# Patient Record
Sex: Female | Born: 1965 | Race: White | Hispanic: No | Marital: Married | Smoking: Never smoker
Health system: Southern US, Community
[De-identification: ages and names within clinical notes are randomized; demographics above are authoritative.]

## PROBLEM LIST (undated history)

## (undated) DIAGNOSIS — K219 Gastro-esophageal reflux disease without esophagitis: Secondary | ICD-10-CM

---

## 2004-02-14 ENCOUNTER — Emergency Department: Payer: Self-pay | Admitting: Emergency Medicine

## 2006-05-06 ENCOUNTER — Other Ambulatory Visit: Payer: Self-pay

## 2006-05-06 ENCOUNTER — Emergency Department: Payer: Self-pay | Admitting: Emergency Medicine

## 2013-09-22 ENCOUNTER — Emergency Department: Payer: Self-pay | Admitting: Emergency Medicine

## 2013-09-22 LAB — COMPREHENSIVE METABOLIC PANEL
ALBUMIN: 3.7 g/dL (ref 3.4–5.0)
AST: 25 U/L (ref 15–37)
Alkaline Phosphatase: 49 U/L
Anion Gap: 7 (ref 7–16)
BUN: 13 mg/dL (ref 7–18)
Bilirubin,Total: 0.7 mg/dL (ref 0.2–1.0)
CO2: 25 mmol/L (ref 21–32)
Calcium, Total: 8.8 mg/dL (ref 8.5–10.1)
Chloride: 109 mmol/L — ABNORMAL HIGH (ref 98–107)
Creatinine: 0.86 mg/dL (ref 0.60–1.30)
EGFR (African American): >60
EGFR (Non-African Amer.): >60
Glucose: 104 mg/dL — ABNORMAL HIGH (ref 65–99)
Osmolality: 282 (ref 275–301)
POTASSIUM: 4.3 mmol/L (ref 3.5–5.1)
SGPT (ALT): 21 U/L
Sodium: 141 mmol/L (ref 136–145)
TOTAL PROTEIN: 8 g/dL (ref 6.4–8.2)

## 2013-09-22 LAB — CBC
HCT: 38.9 % (ref 35.0–47.0)
HGB: 12.3 g/dL (ref 12.0–16.0)
MCH: 25 pg — AB (ref 26.0–34.0)
MCHC: 31.7 g/dL — AB (ref 32.0–36.0)
MCV: 79 fL — ABNORMAL LOW (ref 80–100)
PLATELETS: 312 10*3/uL (ref 150–440)
RBC: 4.93 10*6/uL (ref 3.80–5.20)
RDW: 16.6 % — ABNORMAL HIGH (ref 11.5–14.5)
WBC: 6.9 10*3/uL (ref 3.6–11.0)

## 2013-09-22 LAB — TROPONIN I: Troponin-I: <0.02 ng/mL

## 2014-08-21 ENCOUNTER — Ambulatory Visit: Payer: Self-pay | Admitting: Internal Medicine

## 2014-12-04 ENCOUNTER — Ambulatory Visit: Payer: Self-pay | Admitting: Internal Medicine

## 2018-08-17 ENCOUNTER — Observation Stay
Admission: EM | Admit: 2018-08-17 | Discharge: 2018-08-18 | Disposition: A | Discharge status: Home / Self Care | Payer: Managed Care, Other (non HMO) | Attending: Internal Medicine | Admitting: Internal Medicine

## 2018-08-17 ENCOUNTER — Other Ambulatory Visit: Payer: Self-pay

## 2018-08-17 ENCOUNTER — Emergency Department: Payer: Managed Care, Other (non HMO)

## 2018-08-17 ENCOUNTER — Encounter: Payer: Self-pay | Admitting: Emergency Medicine

## 2018-08-17 DIAGNOSIS — Z79899 Other long term (current) drug therapy: Secondary | ICD-10-CM | POA: Diagnosis not present

## 2018-08-17 DIAGNOSIS — D509 Iron deficiency anemia, unspecified: Principal | ICD-10-CM | POA: Insufficient documentation

## 2018-08-17 DIAGNOSIS — K219 Gastro-esophageal reflux disease without esophagitis: Secondary | ICD-10-CM | POA: Diagnosis not present

## 2018-08-17 DIAGNOSIS — R079 Chest pain, unspecified: Secondary | ICD-10-CM

## 2018-08-17 DIAGNOSIS — Z9119 Patient's noncompliance with other medical treatment and regimen: Secondary | ICD-10-CM | POA: Diagnosis not present

## 2018-08-17 DIAGNOSIS — Z791 Long term (current) use of non-steroidal anti-inflammatories (NSAID): Secondary | ICD-10-CM | POA: Insufficient documentation

## 2018-08-17 DIAGNOSIS — R030 Elevated blood-pressure reading, without diagnosis of hypertension: Secondary | ICD-10-CM | POA: Insufficient documentation

## 2018-08-17 DIAGNOSIS — D649 Anemia, unspecified: Secondary | ICD-10-CM | POA: Diagnosis present

## 2018-08-17 DIAGNOSIS — R002 Palpitations: Secondary | ICD-10-CM | POA: Insufficient documentation

## 2018-08-17 HISTORY — DX: Gastro-esophageal reflux disease without esophagitis: K21.9

## 2018-08-17 LAB — CBC
HCT: 28 % — ABNORMAL LOW (ref 36.0–46.0)
Hemoglobin: 7.2 g/dL — ABNORMAL LOW (ref 12.0–15.0)
MCH: 16.9 pg — ABNORMAL LOW (ref 26.0–34.0)
MCHC: 25.7 g/dL — ABNORMAL LOW (ref 30.0–36.0)
MCV: 65.6 fL — ABNORMAL LOW (ref 80.0–100.0)
Platelets: 333 10*3/uL (ref 150–400)
RBC: 4.27 MIL/uL (ref 3.87–5.11)
RDW: 20.7 % — ABNORMAL HIGH (ref 11.5–15.5)
WBC: 5.6 10*3/uL (ref 4.0–10.5)
nRBC: 0 % (ref 0.0–0.2)

## 2018-08-17 LAB — IRON AND TIBC
Iron: 16 ug/dL — ABNORMAL LOW (ref 28–170)
Saturation Ratios: 3 % — ABNORMAL LOW (ref 10.4–31.8)
TIBC: 505 ug/dL — ABNORMAL HIGH (ref 250–450)
UIBC: 489 ug/dL

## 2018-08-17 LAB — BASIC METABOLIC PANEL
Anion gap: 9 (ref 5–15)
BUN: 13 mg/dL (ref 6–20)
CO2: 21 mmol/L — ABNORMAL LOW (ref 22–32)
Calcium: 8.7 mg/dL — ABNORMAL LOW (ref 8.9–10.3)
Chloride: 107 mmol/L (ref 98–111)
Creatinine, Ser: 0.6 mg/dL (ref 0.44–1.00)
GFR calc Af Amer: >60 mL/min (ref >60)
GFR calc non Af Amer: >60 mL/min (ref >60)
Glucose, Bld: 135 mg/dL — ABNORMAL HIGH (ref 70–99)
Potassium: 3.9 mmol/L (ref 3.5–5.1)
Sodium: 137 mmol/L (ref 135–145)

## 2018-08-17 LAB — PREPARE RBC (CROSSMATCH)

## 2018-08-17 LAB — TSH: TSH: 3.16 u[IU]/mL (ref 0.350–4.500)

## 2018-08-17 LAB — FOLATE: Folate: 48 ng/mL (ref >5.9)

## 2018-08-17 LAB — TROPONIN I (HIGH SENSITIVITY): Troponin I (High Sensitivity): 4 ng/L (ref <18)

## 2018-08-17 LAB — LACTATE DEHYDROGENASE: LDH: 141 U/L (ref 98–192)

## 2018-08-17 LAB — FIBRIN DERIVATIVES D-DIMER (ARMC ONLY): Fibrin derivatives D-dimer (ARMC): 387 ng/mL (FEU) (ref 0.00–499.00)

## 2018-08-17 LAB — OCCULT BLOOD X 1 CARD TO LAB, STOOL: Fecal Occult Bld: NEGATIVE

## 2018-08-17 LAB — ABO/RH: ABO/RH(D): O POS

## 2018-08-17 LAB — FERRITIN: Ferritin: 3 ng/mL — ABNORMAL LOW (ref 11–307)

## 2018-08-17 LAB — VITAMIN B12: Vitamin B-12: 1664 pg/mL — ABNORMAL HIGH (ref 180–914)

## 2018-08-17 MED ORDER — LABETALOL HCL 5 MG/ML IV SOLN: 10.0000 mg | INTRAVENOUS | Status: DC | PRN

## 2018-08-17 MED ORDER — SODIUM CHLORIDE 0.9% IV SOLUTION
Freq: Once | INTRAVENOUS | Status: AC
  Administered 2018-08-17: 14:00:00 via INTRAVENOUS
  Filled 2018-08-17: qty 250

## 2018-08-17 MED ORDER — FERROUS FUMARATE 324 (106 FE) MG PO TABS
1.0000 | ORAL_TABLET | Freq: Two times a day (BID) | ORAL | Status: DC
  Administered 2018-08-17 – 2018-08-18 (×2): 106 mg via ORAL
  Filled 2018-08-17 (×3): qty 1

## 2018-08-17 MED ORDER — ALUM & MAG HYDROXIDE-SIMETH 200-200-20 MG/5ML PO SUSP
30.0000 mL | ORAL | Status: DC | PRN
  Administered 2018-08-17 (×2): 30 mL via ORAL
  Filled 2018-08-17 (×2): qty 30

## 2018-08-17 MED ORDER — SODIUM CHLORIDE 0.9% FLUSH
3.0000 mL | Freq: Once | INTRAVENOUS | Status: AC
  Administered 2018-08-17: 14:00:00 3 mL via INTRAVENOUS

## 2018-08-17 MED ORDER — SODIUM CHLORIDE 0.9 % IV SOLN
INTRAVENOUS | Status: DC
  Administered 2018-08-17 – 2018-08-18 (×2): via INTRAVENOUS

## 2018-08-17 MED ORDER — PANTOPRAZOLE SODIUM 40 MG PO TBEC
40.0000 mg | DELAYED_RELEASE_TABLET | Freq: Every day | ORAL | Status: DC
  Administered 2018-08-17 – 2018-08-18 (×2): 40 mg via ORAL
  Filled 2018-08-17 (×2): qty 1

## 2018-08-17 NOTE — Plan of Care (Signed)
Patient s/p 1 unit PRBc transfusion, tolerated well. VSS. No c/o pain, states she feels much better. Ambulates ad lib in room.  Problem: Education: Goal: Knowledge of General Education information will improve Description: Including pain rating scale, medication(s)/side effects and non-pharmacologic comfort measures Outcome: Progressing   Problem: Health Behavior/Discharge Planning: Goal: Ability to manage health-related needs will improve Outcome: Progressing   Problem: Clinical Measurements: Goal: Ability to maintain clinical measurements within normal limits will improve Outcome: Progressing Goal: Cardiovascular complication will be avoided Outcome: Progressing   Problem: Activity: Goal: Risk for activity intolerance will decrease Outcome: Progressing   Problem: Elimination: Goal: Will not experience complications related to bowel motility Outcome: Progressing

## 2018-08-17 NOTE — ED Notes (Signed)
ED TO INPATIENT HANDOFF REPORT  ED Nurse Name Sam and Phone #: [phone]  S Name/Age/Gender Robin Berger 53 y.o. female Room/Bed: ED15A/ED15A  Code Status   Code Status: Full Code  Home/SNF/Other Home Patient oriented to: self, place, time and situation Is this baseline? Yes   Triage Complete: Triage complete  Chief Complaint rapid heart rate;difficulty breathing  Triage Note States woke up this morning feeling palpitations.  Has had similar symptoms in the past related to GERD.  Patient is anxious and tearful in Triage.  States "I am just tired".   Allergies No Known Allergies  Level of Care/Admitting Diagnosis ED Disposition    ED Disposition Condition [facility]: [place] [[id number]]  Level of Care: Med-Surg [16]  Covid Evaluation: Confirmed COVID Negative  Diagnosis: Symptomatic anemia [8242353]  Admitting Physician: Lang Snow [[id number]]  Attending Physician: Rufina Falco ACHIENG [[id number]]  Estimated length of stay: past midnight tomorrow  Certification:: I certify this patient will need inpatient services for at least 2 midnights  PT Class (Do Not Modify): Inpatient [101]  PT Acc Code (Do Not Modify): Private [1]       B Medical/Surgery History Past Medical History:  Diagnosis Date  . GERD (gastroesophageal reflux disease)    History reviewed. No pertinent surgical history.   A IV Location/Drains/Wounds Patient Lines/Drains/Airways Status   Active Line/Drains/Airways    Name:   Placement date:   Placement time:   Site:   Days:   Peripheral IV 08/17/18 Left Antecubital   08/17/18    0904    Antecubital   less than 1          Intake/Output Last 24 hours No intake or output data in the 24 hours ending 08/17/18 1139  Labs/Imaging Results for orders placed or performed during the hospital encounter of 08/17/18 (from the past 48 hour(s))  Basic metabolic panel     Status: Abnormal   Collection Time: 08/17/18  7:36 AM  Result Value Ref Range   Sodium 137 135 - 145 mmol/L   Potassium 3.9 3.5 - 5.1 mmol/L   Chloride 107 98 - 111 mmol/L   CO2 21 (L) 22 - 32 mmol/L   Glucose, Bld 135 (H) 70 - 99 mg/dL   BUN 13 6 - 20 mg/dL   Creatinine, Ser 0.60 0.44 - 1.00 mg/dL   Calcium 8.7 (L) 8.9 - 10.3 mg/dL   GFR calc non Af Amer >60 >60 mL/min   GFR calc Af Amer >60 >60 mL/min   Anion gap 9 5 - 15    Comment: Performed at [facility], [address] [place], [place] [address]  CBC     Status: Abnormal   Collection Time: 08/17/18  7:36 AM  Result Value Ref Range   WBC 5.6 4.0 - 10.5 K/uL   RBC 4.27 3.87 - 5.11 MIL/uL   Hemoglobin 7.2 (L) 12.0 - 15.0 g/dL    Comment: Reticulocyte Hemoglobin testing may be clinically indicated, consider ordering this additional test [id number]    HCT 28.0 (L) 36.0 - 46.0 %   MCV 65.6 (L) 80.0 - 100.0 fL   MCH 16.9 (L) 26.0 - 34.0 pg   MCHC 25.7 (L) 30.0 - 36.0 g/dL   RDW 20.7 (H) 11.5 - 15.5 %   Platelets 333 150 - 400 K/uL   nRBC 0.0 0.0 - 0.2 %    Comment: Performed at [facility], [address] [place],  [place] [id number]  Troponin I (High Sensitivity)     Status: None   Collection Time: 08/17/18  7:36 AM  Result Value Ref Range   Troponin I (High Sensitivity) 4 <18 ng/L    Comment: (NOTE) Elevated high sensitivity troponin I (hsTnI) values and significant  changes across serial measurements may suggest ACS but many other  chronic and acute conditions are known to elevate hsTnI results.  Refer to the "Links" section for chest pain algorithms and additional  guidance. Performed at [facility], [address] [place], [place] [address]   ABO/Rh     Status: None   Collection Time: 08/17/18  7:36 AM  Result Value Ref Range   ABO/RH(D)      O POS Performed at [facility], [address] [place], [place] [address]   Fibrin derivatives D-Dimer [facility] only)     Status: None    Collection Time: 08/17/18  8:30 AM  Result Value Ref Range   Fibrin derivatives D-dimer ([facility]) 387.00 0.00 - 499.00 ng/mL (FEU)    Comment: (NOTE) <> Exclusion of Venous Thromboembolism (VTE) - OUTPATIENT ONLY   (Emergency Department or [place])   0-499 ng/ml (FEU): With a low to intermediate pretest probability                      for VTE this test result excludes the diagnosis                      of VTE.   >499 ng/ml (FEU) : VTE not excluded; additional work up for VTE is                      required. <> Testing on Inpatients and Evaluation of Disseminated Intravascular   Coagulation (DIC) Reference Range:   0-499 ng/ml (FEU) Performed at [facility], [address] [place], [place] [address]   Type and screen [facility]     Status: None (Preliminary result)   Collection Time: 08/17/18 10:34 AM  Result Value Ref Range   ABO/RH(D) PENDING    Antibody Screen PENDING    Sample Expiration      08/20/2018,2359 Performed at [facility], [address] [place], [place] [address]    Dg Chest Port 1 View  Result Date: 08/17/2018 CLINICAL DATA:  Palpitations. EXAM: PORTABLE CHEST 1 VIEW COMPARISON:  September 22, 2013 FINDINGS: The cardiomediastinal silhouette is unchanged given technique. No pneumothorax. No pulmonary nodules, masses, or focal infiltrates. No overt edema. IMPRESSION: No active disease. Electronically Signed   By: Gerome Samavid  Williams III M.D   On: 08/17/2018 08:44    Pending Labs Unresulted Labs (From admission, onward)    Start     Ordered   08/17/18 1112  Lactate dehydrogenase  Once,   STAT     08/17/18 1111   08/17/18 1111  Iron and TIBC  Once,   STAT     08/17/18 1111   08/17/18 1111  TSH  Once,   STAT     08/17/18 1111   08/17/18 1111  Folate  Once,   STAT     08/17/18 1111   08/17/18 1111  Vitamin B12  Once,   STAT     08/17/18 1111   08/17/18 1110  Ferritin  Once,   STAT     08/17/18 1111   08/17/18 1109  Occult  blood card to lab, stool  Once,  STAT     08/17/18 1111   08/17/18 1107  HIV antibody (Routine Testing)  Once,   STAT     08/17/18 1111   08/17/18 1015  Prepare RBC  (Adult Blood Administration - Red Blood Cells)  ONCE - STAT,   STAT    Question Answer Comment  # of Units 1 unit   Transfusion Indications Symptomatic Anemia   If emergent release call blood bank Not emergent release      08/17/18 1014          Vitals/Pain Today's Vitals   08/17/18 0941 08/17/18 0948 08/17/18 1000 08/17/18 1030  BP: 135/71  (!) 168/85 (!) 149/84  Pulse: 92  96 84  Resp: 10  18 12   Temp:      TempSrc:      SpO2: 100%  100% 100%  Weight:      Height:      PainSc:  0-No pain      Isolation Precautions No active isolations  Medications Medications  sodium chloride flush (NS) 0.9 % injection 3 mL (has no administration in time range)  0.9 %  sodium chloride infusion (Manually program via Guardrails IV Fluids) (has no administration in time range)  pantoprazole (PROTONIX) EC tablet 40 mg (has no administration in time range)  0.9 %  sodium chloride infusion (has no administration in time range)    Mobility walks Low fall risk   Focused Assessments Cardiac Assessment Handoff:  Cardiac Rhythm: Normal sinus rhythm Lab Results  Component Value Date   TROPONINI < 0.02 09/22/2013   No results found for: DDIMER Does the Patient currently have chest pain? No     R Recommendations: See Admitting Provider Note  Report given to:   Additional Notes:

## 2018-08-17 NOTE — ED Notes (Signed)
Pt up to toilet 

## 2018-08-17 NOTE — ED Notes (Signed)
Attempted to call for pt and RN currently in middle of discharge and will call back.

## 2018-08-17 NOTE — H&P (Addendum)
[facility]   PATIENT NAME: Robin Berger    MR#:  [id number]  DATE OF BIRTH:  18-Feb-1965  DATE OF ADMISSION:  08/17/2018  PRIMARY CARE PHYSICIAN: Patient, No Pcp Per   REQUESTING/REFERRING PHYSICIAN: Artis DelayFunke, Mary, MD  CHIEF COMPLAINT:   Chief Complaint  Patient presents with  . Palpitations    HISTORY OF PRESENT ILLNESS:  53 y.o. female with pertinent past medical history of GERD presenting to the ED with complaints of generalized fatigue, dizziness, feeling lightheaded, shortness of breath and palpitation.  Patient reports gradual onset of symptoms for over 6 months and has been progressive.  She reports that she just does not feel like she has the energy, she tires very easily even at rest.  Patient states she woke up this morning with palpitation without associated diaphoresis, chest pain, cough, nausea or vomiting, headache, abdominal pain, syncope or LOC.  Patient states she has not seen a doctor in several years probably more than 10 years therefore decided to come to the ED today for further evaluation of her symptoms.  On arrival to the ED, she was afebrile with blood pressure 162/69 mm Hg and pulse rate 99 beats/min. There were no focal neurological deficits; she was alert and oriented x4, and he did not demonstrate any memory deficits. ECG showed sinus rhythm of 96 beats per minute and the chest X-ray was normal without evidence of active cardiopulmonary disease.  PAST MEDICAL HISTORY:   Past Medical History:  Diagnosis Date  . GERD (gastroesophageal reflux disease)     PAST SURGICAL HISTORY:  History reviewed. No pertinent surgical history.  SOCIAL HISTORY:   Social History   Tobacco Use  . Smoking status: Never Smoker  . Smokeless tobacco: Never Used  Substance Use Topics  . Alcohol use: Yes    Comment: rare    FAMILY HISTORY:  History reviewed. No pertinent family history.  DRUG ALLERGIES:  No Known  Allergies  REVIEW OF SYSTEMS:   Review of Systems  Constitutional: Positive for malaise/fatigue. Negative for chills, fever and weight loss.  HENT: Negative for congestion, hearing loss and sore throat.   Eyes: Negative for blurred vision and double vision.  Respiratory: Positive for shortness of breath. Negative for cough and wheezing.   Cardiovascular: Positive for palpitations. Negative for chest pain, orthopnea and leg swelling.  Gastrointestinal: Negative for abdominal pain, diarrhea, nausea and vomiting.  Genitourinary: Negative for dysuria and urgency.  Musculoskeletal: Negative for myalgias.  Skin: Negative for rash.  Neurological: Positive for dizziness and weakness. Negative for sensory change, speech change, focal weakness and headaches.  Psychiatric/Behavioral: Negative for depression.   MEDICATIONS AT HOME:   Prior to Admission medications   Medication Sig Start Date End Date Taking? Authorizing Provider  Esomeprazole Magnesium 20 MG TBEC Take 20 mg by mouth daily.   Yes [provider]  ibuprofen (ADVIL) 200 MG tablet Take 200-400 mg by mouth every 6 (six) hours as needed for headache or mild pain.   Yes [provider]      VITAL SIGNS:  Blood pressure (!) 141/72, pulse 86, temperature 98.8 F (37.1 C), temperature source Oral, resp. rate 14, height 5\' 3"  (1.6 m), weight 84.3 kg, last menstrual period 07/27/2018, SpO2 100 %.  PHYSICAL EXAMINATION:   Physical Exam  GENERAL:  53 y.o.-year-old patient lying in the bed with no acute distress.  EYES: Pupils equal, round, reactive to light and accommodation. No scleral icterus. Extraocular muscles intact.  HEENT: Head atraumatic, normocephalic. Oropharynx and nasopharynx clear.  NECK:  Supple, no jugular venous distention. No thyroid enlargement, no tenderness.  LUNGS: Normal breath sounds bilaterally, no wheezing, rales,rhonchi or crepitation. No use of accessory muscles of respiration.   CARDIOVASCULAR: S1, S2 normal. No murmurs, rubs, or gallops.  ABDOMEN: Soft, nontender, nondistended. Bowel sounds present. No organomegaly or mass.  EXTREMITIES: No pedal edema, cyanosis, or clubbing. No rash or lesions. + pedal pulses MUSCULOSKELETAL: Normal bulk, and power was 5+ grip and elbow, knee, and ankle flexion and extension bilaterally.  NEUROLOGIC:Alert and oriented x 3. CN 2-12 intact. Sensation to light touch and cold stimuli intact bilaterally. Finger to nose nl. Babinski is downgoing. DTR's (biceps, patellar, and achilles) 2+ and symmetric throughout. Gait not tested due to safety concern. PSYCHIATRIC: The patient is alert and oriented x 3.  SKIN: No obvious rash, lesion, or ulcer.   DATA REVIEWED:  LABORATORY PANEL:   CBC Recent Labs  Lab 08/17/18 0736  WBC 5.6  HGB 7.2*  HCT 28.0*  PLT 333   ------------------------------------------------------------------------------------------------------------------  Chemistries  Recent Labs  Lab 08/17/18 0736  NA 137  K 3.9  CL 107  CO2 21*  GLUCOSE 135*  BUN 13  CREATININE 0.60  CALCIUM 8.7*   ------------------------------------------------------------------------------------------------------------------  Cardiac Enzymes No results for input(s): TROPONINI in the last 168 hours. ------------------------------------------------------------------------------------------------------------------  RADIOLOGY:  Dg Chest Port 1 View  Result Date: 08/17/2018 CLINICAL DATA:  Palpitations. EXAM: PORTABLE CHEST 1 VIEW COMPARISON:  September 22, 2013 FINDINGS: The cardiomediastinal silhouette is unchanged given technique. No pneumothorax. No pulmonary nodules, masses, or focal infiltrates. No overt edema. IMPRESSION: No active disease. Electronically Signed   By: Dorise Bullion III M.D   On: 08/17/2018 08:44    EKG:  EKG: normal EKG, normal sinus rhythm, unchanged from previous tracings. Vent. rate 96 BPM PR interval  136 ms QRS duration 74 ms QT/QTc 376/475 ms P-R-T axes 26 24 16  IMPRESSION AND PLAN:   52 y.o. female with pertinent past medical history of GERD presenting to the ED with complaints of generalized fatigue, dizziness, feeling lightheaded, shortness of breath and palpitation  1. Iron deficiency anemia - Patient symptomatic, further work up notable for low ferritin of 3, iron 16, TIBC 505, hgb 7.2 - Admit to medsurg unit - Transfuse 1 unit of PRBCs - IVF resuscitation to maintain MAP>65 - H&H monitoring q6h - Transfuse PRN Hgb<7 - Start iron supplements - Consult to Hematology/oncology  2. GERD- Protonix  3. Elevated Blood pressure - Likely with undiagnosed underlying BP, patient has not been to the doctor in over 10 years - PRN Labetalol   4. DVT prophylaxis - Hold anti-coagulation for severe anemia as bove   All the records are reviewed and case discussed with ED provider. Management plans discussed with the patient, family and they are in agreement.  CODE STATUS: FULL TOTAL TIME TAKING CARE OF THIS PATIENT: 40 minutes.    on 08/17/2018 at [place], DNP, FNP-BC [facility] Nurse Practitioner Between 7am to 6pm - Pager [phone]  After 6pm go to [email or web address] - password [email or web address] [facility]  Office  [phone]  CC: Primary care physician; Patient, No Pcp Per

## 2018-08-17 NOTE — ED Triage Notes (Signed)
States woke up this morning feeling palpitations.  Has had similar symptoms in the past related to GERD.  Patient is anxious and tearful in Triage.  States "I am just tired".

## 2018-08-17 NOTE — ED Provider Notes (Signed)
[facility] Emergency Department Provider Note  ____________________________________________   First MD Initiated Contact with Patient 08/17/18 [id number]     (approximate)  I have reviewed the triage vital signs and the nursing notes.   HISTORY  Chief Complaint Palpitations    HPI Robin Berger is a 53 y.o. female with prior history of GERD who presents for palpitations.  Patient said last night she developed some palpitations.  She also has a sensation of some difficulty with breathing and when she takes a deep breath she feels like she has difficulty with it.  The palpitations have been intermittent, currently resolved, nothing makes them better, nothing makes them worse.  She denies having these previously.  She does have a history of anxiety attacks but says this feels little bit different than prior.  She is tearful though upon assessment claiming that she is just worried that this might be something more serious.  She denies alcohol, drugs, smoking.  Denies history of heart attack.  No family history of heart attack.  Denies choking on something.  Has been tolerating p.o. this morning          Past Medical History:  Diagnosis Date  . GERD (gastroesophageal reflux disease)     There are no active problems to display for this patient.   History reviewed. No pertinent surgical history.  Prior to Admission medications   Not on File    Allergies Patient has no known allergies.  No family history on file.  Social History Social History   Tobacco Use  . Smoking status: Never Smoker  . Smokeless tobacco: Never Used  Substance Use Topics  . Alcohol use: Yes    Comment: rare  . Drug use: Not on file      Review of Systems Constitutional: No fever/chills Eyes: No visual changes. ENT: No sore throat. Cardiovascular: + chest pain. Respiratory: + SOB Gastrointestinal: No abdominal pain.  No nausea, no vomiting.  No diarrhea.  No  constipation. Genitourinary: Negative for dysuria. Musculoskeletal: Negative for back pain. Skin: Negative for rash. Neurological: Negative for headaches, focal weakness or numbness. All other ROS negative ____________________________________________   PHYSICAL EXAM:  VITAL SIGNS: ED Triage Vitals  Enc Vitals Group     BP 08/17/18 0731 (!) 162/69     Pulse Rate 08/17/18 0731 99     Resp 08/17/18 0731 20     Temp 08/17/18 0731 98.9 F (37.2 C)     Temp Source 08/17/18 0731 Oral     SpO2 08/17/18 0731 100 %     Weight 08/17/18 0732 190 lb (86.2 kg)     Height 08/17/18 0732 5\' 3"  (1.6 m)     Head Circumference --      Peak Flow --      Pain Score --      Pain Loc --      Pain Edu? --      Excl. in GC? --     Constitutional: Alert and oriented. Well appearing and in no acute distress but tearful Eyes: Conjunctivae are normal. EOMI. Head: Atraumatic. Nose: No congestion/rhinnorhea. Mouth/Throat: Mucous membranes are moist.   Neck: No stridor. Trachea Midline. FROM Cardiovascular: Normal rate, regular rhythm. Grossly normal heart sounds.  Good peripheral circulation. Respiratory: Normal respiratory effort.  No retractions. Lungs CTAB. Gastrointestinal: Soft and nontender. No distention. No abdominal bruits.  Musculoskeletal: No lower extremity tenderness nor edema.  No joint effusions. Neurologic:  Normal speech and language. No gross  focal neurologic deficits are appreciated.  Skin:  Skin is warm, dry and intact.pale  Psychiatric: tearful  Speech and behavior are normal. GU: Deferred   ____________________________________________   LABS (all labs ordered are listed, but only abnormal results are displayed)  Labs Reviewed  BASIC METABOLIC PANEL - Abnormal; Notable for the following components:      Result Value   CO2 21 (*)    Glucose, Bld 135 (*)    Calcium 8.7 (*)    All other components within normal limits  CBC - Abnormal; Notable for the following components:    Hemoglobin 7.2 (*)    HCT 28.0 (*)    MCV 65.6 (*)    MCH 16.9 (*)    MCHC 25.7 (*)    RDW 20.7 (*)    All other components within normal limits  FERRITIN - Abnormal; Notable for the following components:   Ferritin 3 (*)    All other components within normal limits  IRON AND TIBC - Abnormal; Notable for the following components:   Iron 16 (*)    TIBC 505 (*)    Saturation Ratios 3 (*)    All other components within normal limits  TROPONIN I (HIGH SENSITIVITY)  FIBRIN DERIVATIVES D-DIMER ([facility] ONLY)  TSH  FOLATE  LACTATE DEHYDROGENASE  HIV ANTIBODY (ROUTINE TESTING W REFLEX)  OCCULT BLOOD X 1 CARD TO LAB, STOOL  VITAMIN B12  POC URINE PREG, ED  TYPE AND SCREEN  PREPARE RBC (CROSSMATCH)  ABO/RH   ____________________________________________   ED ECG REPORT I, Concha SeMary E Kandice Schmelter, the attending physician, personally viewed and interpreted this ECG.  EKG normal sinus rate of 96, no ST elevation, flipped T wave in lead III, normal intervals, normal axis. ____________________________________________  RADIOLOGY Vela ProseI, Aleeha Boline E Susa Bones, personally viewed and evaluated these images (plain radiographs) as part of my medical decision making, as well as reviewing the written report by the radiologist.  ED MD interpretation: Chest x-ray negative  Official radiology report(s): Dg Chest Port 1 View  Result Date: 08/17/2018 CLINICAL DATA:  Palpitations. EXAM: PORTABLE CHEST 1 VIEW COMPARISON:  September 22, 2013 FINDINGS: The cardiomediastinal silhouette is unchanged given technique. No pneumothorax. No pulmonary nodules, masses, or focal infiltrates. No overt edema. IMPRESSION: No active disease. Electronically Signed   By: Gerome Samavid  Williams III M.D   On: 08/17/2018 08:44    ____________________________________________   PROCEDURES  Procedure(s) performed (including Critical Care):  Procedures   ____________________________________________   INITIAL IMPRESSION / ASSESSMENT AND PLAN / ED  COURSE    Robin Berger was evaluated in Emergency Department on 08/17/2018 for the symptoms described in the history of present illness. She was evaluated in the context of the global COVID-19 pandemic, which necessitated consideration that the patient might be at risk for infection with the SARS-CoV-2 virus that causes COVID-19. Institutional protocols and algorithms that pertain to the evaluation of patients at risk for COVID-19 are in a state of rapid change based on information released by regulatory bodies including the CDC and federal and state organizations. These policies and algorithms were followed during the patient's care in the ED.    This is most concerning for atypical chest pain however will get cardiac markers to rule ACS.  Also  [facility] due to patient's age and her tachycardia.  Therefore will get d-dimer to rule out PE although my suspicion is low.  Will get labs to evaluate for anemia.  Denies any infectious symptoms suggest pneumonia.  Clinical Course as of Aug 17 1651  Tue Aug 17, 2018  0851 Hemoglobin(!): 7.2 [MF]  [phone] Patient's hemoglobin reported to be 7.2.  Unclear what her baseline is.   [MF]    Clinical Course User Index [MF] Vanessa Bonny Doon, MD    Patient stood up and feels slightly lightheaded.  Patient endorses a severe fatigue for past few weeks.  She says that she has had continued monthly periods that are heavy in nature.  She denies any rectal bleeding.  Discussed with patient and she felt more comfortable being admitted to the hospital and being transfused a unit for symptomatic anemia.  Discussed with medicine and patient will be admitted.  ____________________________________________   FINAL CLINICAL IMPRESSION(S) / ED DIAGNOSES   Final diagnoses:  Symptomatic anemia  Heart palpitations      MEDICATIONS GIVEN DURING THIS VISIT:  Medications  pantoprazole (PROTONIX) EC tablet 40 mg (40 mg Oral Given 08/17/18 1311)  0.9 %   sodium chloride infusion ( Intravenous New Bag/Given 08/17/18 1424)  alum & mag hydroxide-simeth (MAALOX/MYLANTA) 200-200-20 MG/5ML suspension 30 mL (30 mLs Oral Given 08/17/18 1622)  sodium chloride flush (NS) 0.9 % injection 3 mL (3 mLs Intravenous Given 08/17/18 1419)  0.9 %  sodium chloride infusion (Manually program via Guardrails IV Fluids) ( Intravenous New Bag/Given 08/17/18 1410)     ED Discharge Orders    None       Note:  This document was prepared using Dragon voice recognition software and may include unintentional dictation errors.   Vanessa Anguilla, MD 08/17/18 [phone]

## 2018-08-18 ENCOUNTER — Other Ambulatory Visit: Payer: Self-pay | Admitting: Oncology

## 2018-08-18 DIAGNOSIS — R5383 Other fatigue: Secondary | ICD-10-CM | POA: Diagnosis not present

## 2018-08-18 DIAGNOSIS — R531 Weakness: Secondary | ICD-10-CM | POA: Diagnosis not present

## 2018-08-18 DIAGNOSIS — D509 Iron deficiency anemia, unspecified: Secondary | ICD-10-CM

## 2018-08-18 LAB — TYPE AND SCREEN
ABO/RH(D): O POS
Antibody Screen: NEGATIVE
Unit division: 0

## 2018-08-18 LAB — HEMOGLOBIN: Hemoglobin: 7.6 g/dL — ABNORMAL LOW (ref 12.0–15.0)

## 2018-08-18 LAB — BPAM RBC
Blood Product Expiration Date: 202008032359
ISSUE DATE / TIME: 202007071406
Unit Type and Rh: 5100

## 2018-08-18 LAB — HIV ANTIBODY (ROUTINE TESTING W REFLEX): HIV Screen 4th Generation wRfx: NONREACTIVE

## 2018-08-18 MED ORDER — FERROUS FUMARATE 324 (106 FE) MG PO TABS: 1.0000 | ORAL_TABLET | Freq: Two times a day (BID) | ORAL | 1 refills | Status: AC

## 2018-08-18 MED ORDER — SODIUM CHLORIDE 0.9 % IV SOLN
200.0000 mg | Freq: Once | INTRAVENOUS | Status: AC
  Administered 2018-08-18: 10:00:00 200 mg via INTRAVENOUS
  Filled 2018-08-18: qty 10

## 2018-08-18 MED ORDER — ASCORBIC ACID 250 MG PO TABS: 250.0000 mg | ORAL_TABLET | Freq: Two times a day (BID) | ORAL | 0 refills | Status: AC

## 2018-08-18 MED ORDER — VITAMIN C 500 MG PO TABS
250.0000 mg | ORAL_TABLET | Freq: Two times a day (BID) | ORAL | Status: DC
  Administered 2018-08-18: 250 mg via ORAL
  Filled 2018-08-18: qty 1

## 2018-08-18 NOTE — Progress Notes (Signed)
Discharge instructions explained to pt/ verbalized an understanding/ iv and tele removed/  Will transport off unit when ride arrives.

## 2018-08-18 NOTE — Discharge Summary (Signed)
[facility]   PATIENT NAME: Robin Berger    MR#:  [id number]  DATE OF BIRTH:  November 16, 1965  DATE OF ADMISSION:  08/17/2018 ADMITTING PHYSICIAN: Jimmye NormanElizabeth Achieng Ouma, NP  DATE OF DISCHARGE: 08/18/2018   PRIMARY CARE PHYSICIAN: Patient, No Pcp Per    ADMISSION DIAGNOSIS:  Heart palpitations [R00.2] Chest pain [R07.9] Symptomatic anemia [D64.9]  DISCHARGE DIAGNOSIS:  Active Problems:   Symptomatic anemia   SECONDARY DIAGNOSIS:   Past Medical History:  Diagnosis Date  . GERD (gastroesophageal reflux disease)     HOSPITAL COURSE:   Patient came with symptomatic anemia complaint.  All the work-up suggested to be iron deficiency anemia.  Patient was not compliant with regular physical checkup and blood works.  She denied any history of GI bleed.  She denied any use of over-the-counter pain medications at home.  She still had menstruation regularly which were slightly on the heavier side.  This could be the reason for her iron deficiency anemia. She received 1 unit of blood transfusion and felt slightly better.  Also given IV iron 1 dose.  We are discharging with oral iron therapy and follow-up with oncology clinic. Oncology saw the patient in the hospital and suggested no further work-up.  DISCHARGE CONDITIONS:   Stable  CONSULTS OBTAINED:  Treatment Team:  Jeralyn RuthsFinnegan, Timothy J, MD  DRUG ALLERGIES:  No Known Allergies  DISCHARGE MEDICATIONS:   Allergies as of 08/18/2018   No Known Allergies     Medication List    TAKE these medications   ascorbic acid 250 MG tablet Commonly known as: VITAMIN C Take 1 tablet (250 mg total) by mouth 2 (two) times daily.   Esomeprazole Magnesium 20 MG Tbec Take 20 mg by mouth daily.   Ferrous Fumarate 324 (106 Fe) MG Tabs tablet Commonly known as: HEMOCYTE - 106 mg FE Take 1 tablet (106 mg of iron total) by mouth 2 (two) times daily.   ibuprofen 200 MG tablet Commonly known as:  ADVIL Take 200-400 mg by mouth every 6 (six) hours as needed for headache or mild pain.        DISCHARGE INSTRUCTIONS:    Follow with oncology clinic in 1 week.  If you experience worsening of your admission symptoms, develop shortness of breath, life threatening emergency, suicidal or homicidal thoughts you must seek medical attention immediately by calling 911 or calling your MD immediately  if symptoms less severe.  You Must read complete instructions/literature along with all the possible adverse reactions/side effects for all the Medicines you take and that have been prescribed to you. Take any new Medicines after you have completely understood and accept all the possible adverse reactions/side effects.   Please note  You were cared for by a hospitalist during your hospital stay. If you have any questions about your discharge medications or the care you received while you were in the hospital after you are discharged, you can call the unit and asked to speak with the hospitalist on call if the hospitalist that took care of you is not available. Once you are discharged, your primary care physician will handle any further medical issues. Please note that NO REFILLS for any discharge medications will be authorized once you are discharged, as it is imperative that you return to your primary care physician (or establish a relationship with a primary care physician if you do not have one) for your aftercare needs so that they can reassess your need for  medications and monitor your lab values.    Today   CHIEF COMPLAINT:   Chief Complaint  Patient presents with  . Palpitations    HISTORY OF PRESENT ILLNESS:  Robin Berger  is a 53 y.o. female with a known history of GERD presenting to the ED with complaints of generalized fatigue, dizziness, feeling lightheaded, shortness of breath and palpitation.  Patient reports gradual onset of symptoms for over 6 months and has been  progressive.  She reports that she just does not feel like she has the energy, she tires very easily even at rest.  Patient states she woke up this morning with palpitation without associated diaphoresis, chest pain, cough, nausea or vomiting, headache, abdominal pain, syncope or LOC.  Patient states she has not seen a doctor in several years probably more than 10 years therefore decided to come to the ED today for further evaluation of her symptoms.  On arrival to the ED, she was afebrile with blood pressure 162/69 mm Hg and pulse rate 99 beats/min. There were no focal neurological deficits; she was alert and oriented x4, and he did not demonstrate any memory deficits. ECG showed sinus rhythm of 96 beats per minute and the chest X-ray was normal without evidence of active cardiopulmonary disease.   VITAL SIGNS:  Blood pressure (!) 144/68, pulse 92, temperature 98 F (36.7 C), temperature source Oral, resp. rate 18, height 5\' 3"  (1.6 m), weight 84.3 kg, last menstrual period 07/27/2018, SpO2 99 %.  I/O:    Intake/Output Summary (Last 24 hours) at 08/18/2018 1306 Last data filed at 08/18/2018 1024 Gross per 24 hour  Intake 1116.07 ml  Output 0 ml  Net 1116.07 ml    PHYSICAL EXAMINATION:  GENERAL:  53 y.o.-year-old patient lying in the bed with no acute distress.  EYES: Pupils equal, round, reactive to light and accommodation. No scleral icterus. Extraocular muscles intact.  HEENT: Head atraumatic, normocephalic. Oropharynx and nasopharynx clear.  NECK:  Supple, no jugular venous distention. No thyroid enlargement, no tenderness.  LUNGS: Normal breath sounds bilaterally, no wheezing, rales,rhonchi or crepitation. No use of accessory muscles of respiration.  CARDIOVASCULAR: S1, S2 normal. No murmurs, rubs, or gallops.  ABDOMEN: Soft, non-tender, non-distended. Bowel sounds present. No organomegaly or mass.  EXTREMITIES: No pedal edema, cyanosis, or clubbing.  NEUROLOGIC: Cranial nerves II  through XII are intact. Muscle strength 5/5 in all extremities. Sensation intact. Gait not checked.  PSYCHIATRIC: The patient is alert and oriented x 3.  SKIN: No obvious rash, lesion, or ulcer.   DATA REVIEW:   CBC Recent Labs  Lab 08/17/18 0736 08/18/18 0056  WBC 5.6  --   HGB 7.2* 7.6*  HCT 28.0*  --   PLT 333  --     Chemistries  Recent Labs  Lab 08/17/18 0736  NA 137  K 3.9  CL 107  CO2 21*  GLUCOSE 135*  BUN 13  CREATININE 0.60  CALCIUM 8.7*    Cardiac Enzymes No results for input(s): TROPONINI in the last 168 hours.  Microbiology Results  No results found for this or any previous visit.  RADIOLOGY:  Dg Chest Port 1 View  Result Date: 08/17/2018 CLINICAL DATA:  Palpitations. EXAM: PORTABLE CHEST 1 VIEW COMPARISON:  September 22, 2013 FINDINGS: The cardiomediastinal silhouette is unchanged given technique. No pneumothorax. No pulmonary nodules, masses, or focal infiltrates. No overt edema. IMPRESSION: No active disease. Electronically Signed   By: Gerome Samavid  Williams III M.D   On: 08/17/2018 08:44  EKG:   Orders placed or performed during the hospital encounter of 08/17/18  . EKG 12-Lead  . EKG 12-Lead  . ED EKG  . ED EKG      Management plans discussed with the patient, family and they are in agreement.  CODE STATUS:     Code Status Orders  (From admission, onward)         Start     Ordered   08/17/18 1108  Full code  Continuous     08/17/18 1111        Code Status History    This patient has a current code status but no historical code status.   Advance Care Planning Activity      TOTAL TIME TAKING CARE OF THIS PATIENT: 35 minutes.    Vaughan Basta M.D on 08/18/2018 at 1:06 PM  Between 7am to 6pm - Pager - [phone]  After 6pm go to [email or web address] - password [email or web address] [facility]  Office  [phone]  CC: Primary care physician; Patient, No Pcp Per   Note: This dictation was prepared with Dragon  dictation along with smaller phrase technology. Any transcriptional errors that result from this process are unintentional.

## 2018-08-18 NOTE — Consult Note (Signed)
[facility]  Telephone:(336) [phone] [phone]) [phone]  ID: Robin Berger OB: 22-Dec-1965  MR#: [id number]  AOZ#:[id number]#:[id number]  Patient Care Team: Patient, No Pcp Per as PCP - General (General Practice)  CHIEF COMPLAINT: Iron deficiency anemia.  INTERVAL HISTORY: Patient is a 53 year old female who presented to the emergency room with increasing weakness and fatigue and was found to have severe iron deficiency anemia.  Despite her age, patient reports she continues to have regular monthly menses.  She denies any other bleeding.  Hemoccult negative in the ER.  She feels significantly improved after receiving blood transfusion and IV iron.  She currently feels well and is asymptomatic.  She has no neurologic complaints.  She denies any recent fevers or illnesses.  She has a good appetite and denies weight loss.  She has no chest pain, shortness of breath, cough, or hemoptysis.  She denies any nausea, vomiting, constipation, or diarrhea.  She has no melena or hematochezia.  She has no urinary complaints.  Patient offers no further specific complaints today.  REVIEW OF SYSTEMS:   Review of Systems  Constitutional: Positive for malaise/fatigue. Negative for fever and weight loss.  Respiratory: Negative.  Negative for cough, hemoptysis and shortness of breath.   Cardiovascular: Negative.  Negative for chest pain and leg swelling.  Gastrointestinal: Negative.  Negative for abdominal pain, blood in stool and melena.  Genitourinary: Negative.  Negative for hematuria.  Musculoskeletal: Negative.  Negative for back pain.  Skin: Negative.  Negative for rash.  Neurological: Positive for weakness. Negative for dizziness, focal weakness and headaches.  Psychiatric/Behavioral: Negative.  The patient is not nervous/anxious.     As per HPI. Otherwise, a complete review of systems is negative.  PAST MEDICAL HISTORY: Past Medical History:  Diagnosis Date  . GERD (gastroesophageal  reflux disease)     PAST SURGICAL HISTORY: History reviewed. No pertinent surgical history.  FAMILY HISTORY: History reviewed. No pertinent family history.  ADVANCED DIRECTIVES (Y/N):  @ADVDIR @  HEALTH MAINTENANCE: Social History   Tobacco Use  . Smoking status: Never Smoker  . Smokeless tobacco: Never Used  Substance Use Topics  . Alcohol use: Yes    Comment: rare  . Drug use: Not on file     Colonoscopy:  PAP:  Bone density:  Lipid panel:  No Known Allergies  Current Facility-Administered Medications  Medication Dose Route Frequency Provider Last Rate Last Dose  . 0.9 %  sodium chloride infusion   Intravenous Continuous Jimmye Normanuma, Elizabeth Achieng, NP 75 mL/hr at 08/18/18 0242    . alum & mag hydroxide-simeth (MAALOX/MYLANTA) 200-200-20 MG/5ML suspension 30 mL  30 mL Oral Q4H PRN Jimmye Normanuma, Elizabeth Achieng, NP   30 mL at 08/17/18 2111  . Ferrous Fumarate (HEMOCYTE - 106 mg FE) tablet 106 mg of iron  1 tablet Oral BID Jimmye Normanuma, Elizabeth Achieng, NP   106 mg of iron at 08/18/18 0817  . labetalol (NORMODYNE) injection 10 mg  10 mg Intravenous Q2H PRN Jimmye Normanuma, Elizabeth Achieng, NP      . pantoprazole (PROTONIX) EC tablet 40 mg  40 mg Oral Daily Jimmye Normanuma, Elizabeth Achieng, NP   40 mg at 08/18/18 0817  . vitamin C (ASCORBIC ACID) tablet 250 mg  250 mg Oral BID Altamese DillingVachhani, Vaibhavkumar, MD   250 mg at 08/18/18 1013    OBJECTIVE: Vitals:   08/18/18 0553 08/18/18 0739  BP:  (!) 144/68  Pulse: 81 92  Resp:  18  Temp:  98 F (36.7 C)  SpO2:  99%     Body mass index is 32.91 kg/m.    ECOG FS:0 - Asymptomatic  General: Well-developed, well-nourished, no acute distress. Eyes: Pink conjunctiva, anicteric sclera. HEENT: Normocephalic, moist mucous membranes, clear oropharnyx. Lungs: Clear to auscultation bilaterally. Heart: Regular rate and rhythm. No rubs, murmurs, or gallops. Abdomen: Soft, nontender, nondistended. No organomegaly noted, normoactive bowel sounds. Musculoskeletal: No  edema, cyanosis, or clubbing. Neuro: Alert, answering all questions appropriately. Cranial nerves grossly intact. Skin: No rashes or petechiae noted. Psych: Normal affect. Lymphatics: No cervical, calvicular, axillary or inguinal LAD.   LAB RESULTS:  Lab Results  Component Value Date   NA 137 08/17/2018   K 3.9 08/17/2018   CL 107 08/17/2018   CO2 21 (L) 08/17/2018   GLUCOSE 135 (H) 08/17/2018   BUN 13 08/17/2018   CREATININE 0.60 08/17/2018   CALCIUM 8.7 (L) 08/17/2018   PROT 8.0 09/22/2013   ALBUMIN 3.7 09/22/2013   AST 25 09/22/2013   ALT 21 09/22/2013   ALKPHOS 49 09/22/2013   BILITOT 0.7 09/22/2013   GFRNONAA >60 08/17/2018   GFRAA >60 08/17/2018    Lab Results  Component Value Date   WBC 5.6 08/17/2018   HGB 7.6 (L) 08/18/2018   HCT 28.0 (L) 08/17/2018   MCV 65.6 (L) 08/17/2018   PLT 333 08/17/2018   Lab Results  Component Value Date   IRON 16 (L) 08/17/2018   TIBC 505 (H) 08/17/2018   IRONPCTSAT 3 (L) 08/17/2018   Lab Results  Component Value Date   FERRITIN 3 (L) 08/17/2018     STUDIES: Dg Chest Port 1 View  Result Date: 08/17/2018 CLINICAL DATA:  Palpitations. EXAM: PORTABLE CHEST 1 VIEW COMPARISON:  September 22, 2013 FINDINGS: The cardiomediastinal silhouette is unchanged given technique. No pneumothorax. No pulmonary nodules, masses, or focal infiltrates. No overt edema. IMPRESSION: No active disease. Electronically Signed   By: Dorise Bullion III M.D   On: 08/17/2018 08:44    ASSESSMENT: Iron deficiency anemia.  PLAN:    1.  Iron deficiency anemia: Likely secondary to patient's persistent menses.  She denies any melena or hematochezia.  Hemoglobin improved after 1 unit of packed red blood cells and IV iron.  No further intervention is needed at this time.  Patient will require a colonoscopy which can be arranged as an outpatient.  Okay to discharge from hematology standpoint.  Patient will follow-up in the cancer center early next week for repeat  laboratory work, further evaluation, and continuation of IV Feraheme.  Appreciate consult, call with questions.   Lloyd Huger, MD   08/18/2018 2:05 PM

## 2018-08-22 DIAGNOSIS — D509 Iron deficiency anemia, unspecified: Secondary | ICD-10-CM | POA: Insufficient documentation

## 2018-08-22 NOTE — Progress Notes (Deleted)
[facility]  Telephone:(336) [phone] [phone]) [phone]  ID: Robin Berger OB: 02-08-1966  MR#: [id number]  AOZ#:[id number]#:[id number]  Patient Care Team: Patient, No Pcp Per as PCP - General (General Practice)  CHIEF COMPLAINT: Iron deficiency anemia  INTERVAL HISTORY: ***  REVIEW OF SYSTEMS:   ROS  As per HPI. Otherwise, a complete review of systems is negative.  PAST MEDICAL HISTORY: Past Medical History:  Diagnosis Date  . GERD (gastroesophageal reflux disease)     PAST SURGICAL HISTORY: No past surgical history on file.  FAMILY HISTORY: No family history on file.  ADVANCED DIRECTIVES (Y/N):  N  HEALTH MAINTENANCE: Social History   Tobacco Use  . Smoking status: Never Smoker  . Smokeless tobacco: Never Used  Substance Use Topics  . Alcohol use: Yes    Comment: rare  . Drug use: Not on file     Colonoscopy:  PAP:  Bone density:  Lipid panel:  No Known Allergies  Current Outpatient Medications  Medication Sig Dispense Refill  . Esomeprazole Magnesium 20 MG TBEC Take 20 mg by mouth daily.    . Ferrous Fumarate (HEMOCYTE - 106 MG FE) 324 (106 Fe) MG TABS tablet Take 1 tablet (106 mg of iron total) by mouth 2 (two) times daily. 60 tablet 1  . ibuprofen (ADVIL) 200 MG tablet Take 200-400 mg by mouth every 6 (six) hours as needed for headache or mild pain.    . vitamin C (VITAMIN C) 250 MG tablet Take 1 tablet (250 mg total) by mouth 2 (two) times daily. 60 tablet 0   No current facility-administered medications for this visit.     OBJECTIVE: There were no vitals filed for this visit.   There is no height or weight on file to calculate BMI.    ECOG FS:{CHL ONC [id number]}  General: Well-developed, well-nourished, no acute distress. Eyes: Pink conjunctiva, anicteric sclera. HEENT: Normocephalic, moist mucous membranes, clear oropharnyx. Lungs: Clear to auscultation bilaterally. Heart: Regular rate and rhythm. No rubs, murmurs, or  gallops. Abdomen: Soft, nontender, nondistended. No organomegaly noted, normoactive bowel sounds. Musculoskeletal: No edema, cyanosis, or clubbing. Neuro: Alert, answering all questions appropriately. Cranial nerves grossly intact. Skin: No rashes or petechiae noted. Psych: Normal affect. Lymphatics: No cervical, calvicular, axillary or inguinal LAD.   LAB RESULTS:  Lab Results  Component Value Date   NA 137 08/17/2018   K 3.9 08/17/2018   CL 107 08/17/2018   CO2 21 (L) 08/17/2018   GLUCOSE 135 (H) 08/17/2018   BUN 13 08/17/2018   CREATININE 0.60 08/17/2018   CALCIUM 8.7 (L) 08/17/2018   PROT 8.0 09/22/2013   ALBUMIN 3.7 09/22/2013   AST 25 09/22/2013   ALT 21 09/22/2013   ALKPHOS 49 09/22/2013   BILITOT 0.7 09/22/2013   GFRNONAA >60 08/17/2018   GFRAA >60 08/17/2018    Lab Results  Component Value Date   WBC 5.6 08/17/2018   HGB 7.6 (L) 08/18/2018   HCT 28.0 (L) 08/17/2018   MCV 65.6 (L) 08/17/2018   PLT 333 08/17/2018     STUDIES: Dg Chest Port 1 View  Result Date: 08/17/2018 CLINICAL DATA:  Palpitations. EXAM: PORTABLE CHEST 1 VIEW COMPARISON:  September 22, 2013 FINDINGS: The cardiomediastinal silhouette is unchanged given technique. No pneumothorax. No pulmonary nodules, masses, or focal infiltrates. No overt edema. IMPRESSION: No active disease. Electronically Signed   By: Gerome Samavid  Williams III M.D   On: 08/17/2018 08:44    ASSESSMENT: Iron deficiency anemia  PLAN:  1. Iron deficiency anemia:  Patient expressed understanding and was in agreement with this plan. She also understands that She can call clinic at any time with any questions, concerns, or complaints.   Cancer Staging No matching staging information was found for the patient.  Lloyd Huger, MD   08/22/2018 12:25 PM

## 2018-08-24 ENCOUNTER — Inpatient Hospital Stay: Payer: Managed Care, Other (non HMO)

## 2018-08-24 ENCOUNTER — Inpatient Hospital Stay: Payer: Managed Care, Other (non HMO) | Admitting: Oncology

## 2018-09-12 NOTE — Progress Notes (Signed)
[facility]  Telephone:(336) [phone] [phone]) [phone]  ID: Robin Berger No OB: 1965-06-15  MR#: [id number]  PPI#:[id number]#:[id number]  Patient Care Team: Patient, No Pcp Per as PCP - General (General Practice)  CHIEF COMPLAINT: Iron deficiency anemia  INTERVAL HISTORY: Patient returns to clinic today for repeat laboratory, further evaluation, and hospital follow-up.  She was recently admitted with significant anemia of less than 6.0 requiring multiple units of packed red blood cells.  She had no evidence of bleeding other than continued heavy menses.  She currently feels well and is nearly back to her baseline.  She does not complain of weakness or fatigue.  She has no neurologic complaints.  She denies any recent fevers or illnesses.  She has a good appetite and denies weight loss.  She has no chest pain, shortness of breath, cough, or hemoptysis.  She denies any nausea, vomiting, constipation, or diarrhea.  She has no melena or hematochezia.  She has no urinary complaints.  Patient offers no specific complaints today.  REVIEW OF SYSTEMS:   Review of Systems  Constitutional: Negative.  Negative for fever, malaise/fatigue and weight loss.  Respiratory: Negative.  Negative for cough, hemoptysis and shortness of breath.   Cardiovascular: Negative.  Negative for chest pain and leg swelling.  Gastrointestinal: Negative.  Negative for abdominal pain, blood in stool and melena.  Genitourinary: Negative.  Negative for hematuria.  Musculoskeletal: Negative.  Negative for back pain.  Skin: Negative.  Negative for rash.  Neurological: Negative.  Negative for dizziness, focal weakness, weakness and headaches.  Psychiatric/Behavioral: Negative.  The patient is not nervous/anxious.     As per HPI. Otherwise, a complete review of systems is negative.  PAST MEDICAL HISTORY: Past Medical History:  Diagnosis Date  . GERD (gastroesophageal reflux disease)     PAST SURGICAL HISTORY:  History reviewed. No pertinent surgical history.  FAMILY HISTORY: History reviewed. No pertinent family history.  ADVANCED DIRECTIVES (Y/N):  N  HEALTH MAINTENANCE: Social History   Tobacco Use  . Smoking status: Never Smoker  . Smokeless tobacco: Never Used  Substance Use Topics  . Alcohol use: Yes    Comment: rare  . Drug use: Not on file     Colonoscopy:  PAP:  Bone density:  Lipid panel:  No Known Allergies  Current Outpatient Medications  Medication Sig Dispense Refill  . Esomeprazole Magnesium 20 MG TBEC Take 20 mg by mouth daily.    . Ferrous Fumarate (HEMOCYTE - 106 MG FE) 324 (106 Fe) MG TABS tablet Take 1 tablet (106 mg of iron total) by mouth 2 (two) times daily. 60 tablet 1  . ibuprofen (ADVIL) 200 MG tablet Take 200-400 mg by mouth every 6 (six) hours as needed for headache or mild pain.    . vitamin C (VITAMIN C) 250 MG tablet Take 1 tablet (250 mg total) by mouth 2 (two) times daily. 60 tablet 0   No current facility-administered medications for this visit.     OBJECTIVE: Vitals:   09/15/18 1040  BP: (!) 179/92  Pulse: (!) 102  Resp: 18  Temp: 98.8 F (37.1 C)     Body mass index is 32.91 kg/m.    ECOG FS:0 - Asymptomatic  General: Well-developed, well-nourished, no acute distress. Eyes: Pink conjunctiva, anicteric sclera. HEENT: Normocephalic, moist mucous membranes, clear oropharnyx. Lungs: Clear to auscultation bilaterally. Heart: Regular rate and rhythm. No rubs, murmurs, or gallops. Abdomen: Soft, nontender, nondistended. No organomegaly noted, normoactive bowel sounds. Musculoskeletal: No edema, cyanosis,  or clubbing. Neuro: Alert, answering all questions appropriately. Cranial nerves grossly intact. Skin: No rashes or petechiae noted. Psych: Normal affect. Lymphatics: No cervical, calvicular, axillary or inguinal LAD.   LAB RESULTS:  Lab Results  Component Value Date   NA 137 08/17/2018   K 3.9 08/17/2018   CL 107 08/17/2018    CO2 21 (L) 08/17/2018   GLUCOSE 135 (H) 08/17/2018   BUN 13 08/17/2018   CREATININE 0.60 08/17/2018   CALCIUM 8.7 (L) 08/17/2018   PROT 8.0 09/22/2013   ALBUMIN 3.7 09/22/2013   AST 25 09/22/2013   ALT 21 09/22/2013   ALKPHOS 49 09/22/2013   BILITOT 0.7 09/22/2013   GFRNONAA >60 08/17/2018   GFRAA >60 08/17/2018    Lab Results  Component Value Date   WBC 4.8 09/15/2018   NEUTROABS 3.3 09/15/2018   HGB 12.6 09/15/2018   HCT 42.0 09/15/2018   MCV 80.3 09/15/2018   PLT 353 09/15/2018   Lab Results  Component Value Date   IRON 38 09/15/2018   TIBC 346 09/15/2018   IRONPCTSAT 12 09/15/2018   Lab Results  Component Value Date   FERRITIN 35 09/15/2018     STUDIES: Dg Chest Port 1 View  Result Date: 08/17/2018 CLINICAL DATA:  Palpitations. EXAM: PORTABLE CHEST 1 VIEW COMPARISON:  September 22, 2013 FINDINGS: The cardiomediastinal silhouette is unchanged given technique. No pneumothorax. No pulmonary nodules, masses, or focal infiltrates. No overt edema. IMPRESSION: No active disease. Electronically Signed   By: Dorise Bullion III M.D   On: 08/17/2018 08:44    ASSESSMENT: Iron deficiency anemia  PLAN:    1. Iron deficiency anemia: Likely secondary to persistent heavy menses.  Patient's hemoglobin and iron stores are now within normal limits and she is asymptomatic.  No further intervention is needed at this time.  Patient does not wish to have any further work-up currently, but did agree that she will likely need a colonoscopy as well as GYN exam in the near future if she continues to have bleeding.  Return to clinic in 3 months with repeat laboratory work and further evaluation.  I spent a total of 30 minutes face-to-face with the patient of which greater than 50% of the visit was spent in counseling and coordination of care as detailed above.  Patient expressed understanding and was in agreement with this plan. She also understands that She can call clinic at any time with any  questions, concerns, or complaints.   Cancer Staging No matching staging information was found for the patient.  Lloyd Huger, MD   09/16/2018 7:23 AM

## 2018-09-15 ENCOUNTER — Inpatient Hospital Stay: Payer: Managed Care, Other (non HMO) | Admitting: Oncology

## 2018-09-15 ENCOUNTER — Inpatient Hospital Stay: Payer: Managed Care, Other (non HMO) | Attending: Oncology

## 2018-09-15 ENCOUNTER — Other Ambulatory Visit: Payer: Self-pay

## 2018-09-15 ENCOUNTER — Encounter: Payer: Self-pay | Admitting: Oncology

## 2018-09-15 ENCOUNTER — Inpatient Hospital Stay: Payer: Managed Care, Other (non HMO)

## 2018-09-15 VITALS — BP 179/92 | HR 102 | Temp 98.8°F | Resp 18 | Wt 185.8 lb

## 2018-09-15 DIAGNOSIS — D509 Iron deficiency anemia, unspecified: Secondary | ICD-10-CM

## 2018-09-15 DIAGNOSIS — R002 Palpitations: Secondary | ICD-10-CM | POA: Diagnosis not present

## 2018-09-15 DIAGNOSIS — Z79899 Other long term (current) drug therapy: Secondary | ICD-10-CM | POA: Diagnosis not present

## 2018-09-15 LAB — CBC WITH DIFFERENTIAL/PLATELET
Abs Immature Granulocytes: 0.02 10*3/uL (ref 0.00–0.07)
Basophils Absolute: 0.1 10*3/uL (ref 0.0–0.1)
Basophils Relative: 1 %
Eosinophils Absolute: 0.1 10*3/uL (ref 0.0–0.5)
Eosinophils Relative: 3 %
HCT: 42 % (ref 36.0–46.0)
Hemoglobin: 12.6 g/dL (ref 12.0–15.0)
Immature Granulocytes: 0 %
Lymphocytes Relative: 23 %
Lymphs Abs: 1.1 10*3/uL (ref 0.7–4.0)
MCH: 24.1 pg — ABNORMAL LOW (ref 26.0–34.0)
MCHC: 30 g/dL (ref 30.0–36.0)
MCV: 80.3 fL (ref 80.0–100.0)
Monocytes Absolute: 0.2 10*3/uL (ref 0.1–1.0)
Monocytes Relative: 4 %
Neutro Abs: 3.3 10*3/uL (ref 1.7–7.7)
Neutrophils Relative %: 69 %
Platelets: 353 10*3/uL (ref 150–400)
RBC: 5.23 MIL/uL — ABNORMAL HIGH (ref 3.87–5.11)
Smear Review: NORMAL
WBC: 4.8 10*3/uL (ref 4.0–10.5)
nRBC: 0 % (ref 0.0–0.2)

## 2018-09-15 LAB — IRON AND TIBC
Iron: 38 ug/dL (ref 28–170)
Saturation Ratios: 12 % (ref 10.4–31.8)
TIBC: 346 ug/dL (ref 250–450)
UIBC: 303 ug/dL

## 2018-09-15 LAB — FERRITIN: Ferritin: 35 ng/mL (ref 11–307)

## 2018-09-15 NOTE — Progress Notes (Signed)
Pt in for hospital follow up, discharged from hosp 3 weeks ago.  Reports improved since being discharged.

## 2018-12-17 ENCOUNTER — Inpatient Hospital Stay: Payer: Managed Care, Other (non HMO)

## 2018-12-20 ENCOUNTER — Inpatient Hospital Stay: Payer: Managed Care, Other (non HMO)

## 2018-12-20 ENCOUNTER — Inpatient Hospital Stay: Payer: Managed Care, Other (non HMO) | Admitting: Oncology

## 2019-01-20 ENCOUNTER — Other Ambulatory Visit: Payer: Self-pay

## 2019-01-20 ENCOUNTER — Telehealth: Payer: Self-pay | Admitting: *Deleted

## 2019-01-20 NOTE — Telephone Encounter (Signed)
Per pt request to [facility] 12/11 lab and 12/15 MD/Infusion appts.  Patient stated that her husband has to be tested for covid19 and she to needs to be tested.  Pt said that she would call back at a later date to R/S

## 2019-01-21 ENCOUNTER — Inpatient Hospital Stay: Payer: Managed Care, Other (non HMO)

## 2019-01-25 ENCOUNTER — Ambulatory Visit: Payer: Managed Care, Other (non HMO)

## 2019-01-25 ENCOUNTER — Ambulatory Visit: Payer: Managed Care, Other (non HMO) | Admitting: Oncology

## 2019-11-07 IMAGING — DX PORTABLE CHEST - 1 VIEW
1 series · 1 of 1 positions shown · non-contrast
Comparison: September 22, 2013

CLINICAL DATA: Palpitations.

EXAM:
PORTABLE CHEST 1 VIEW

[chest ap]
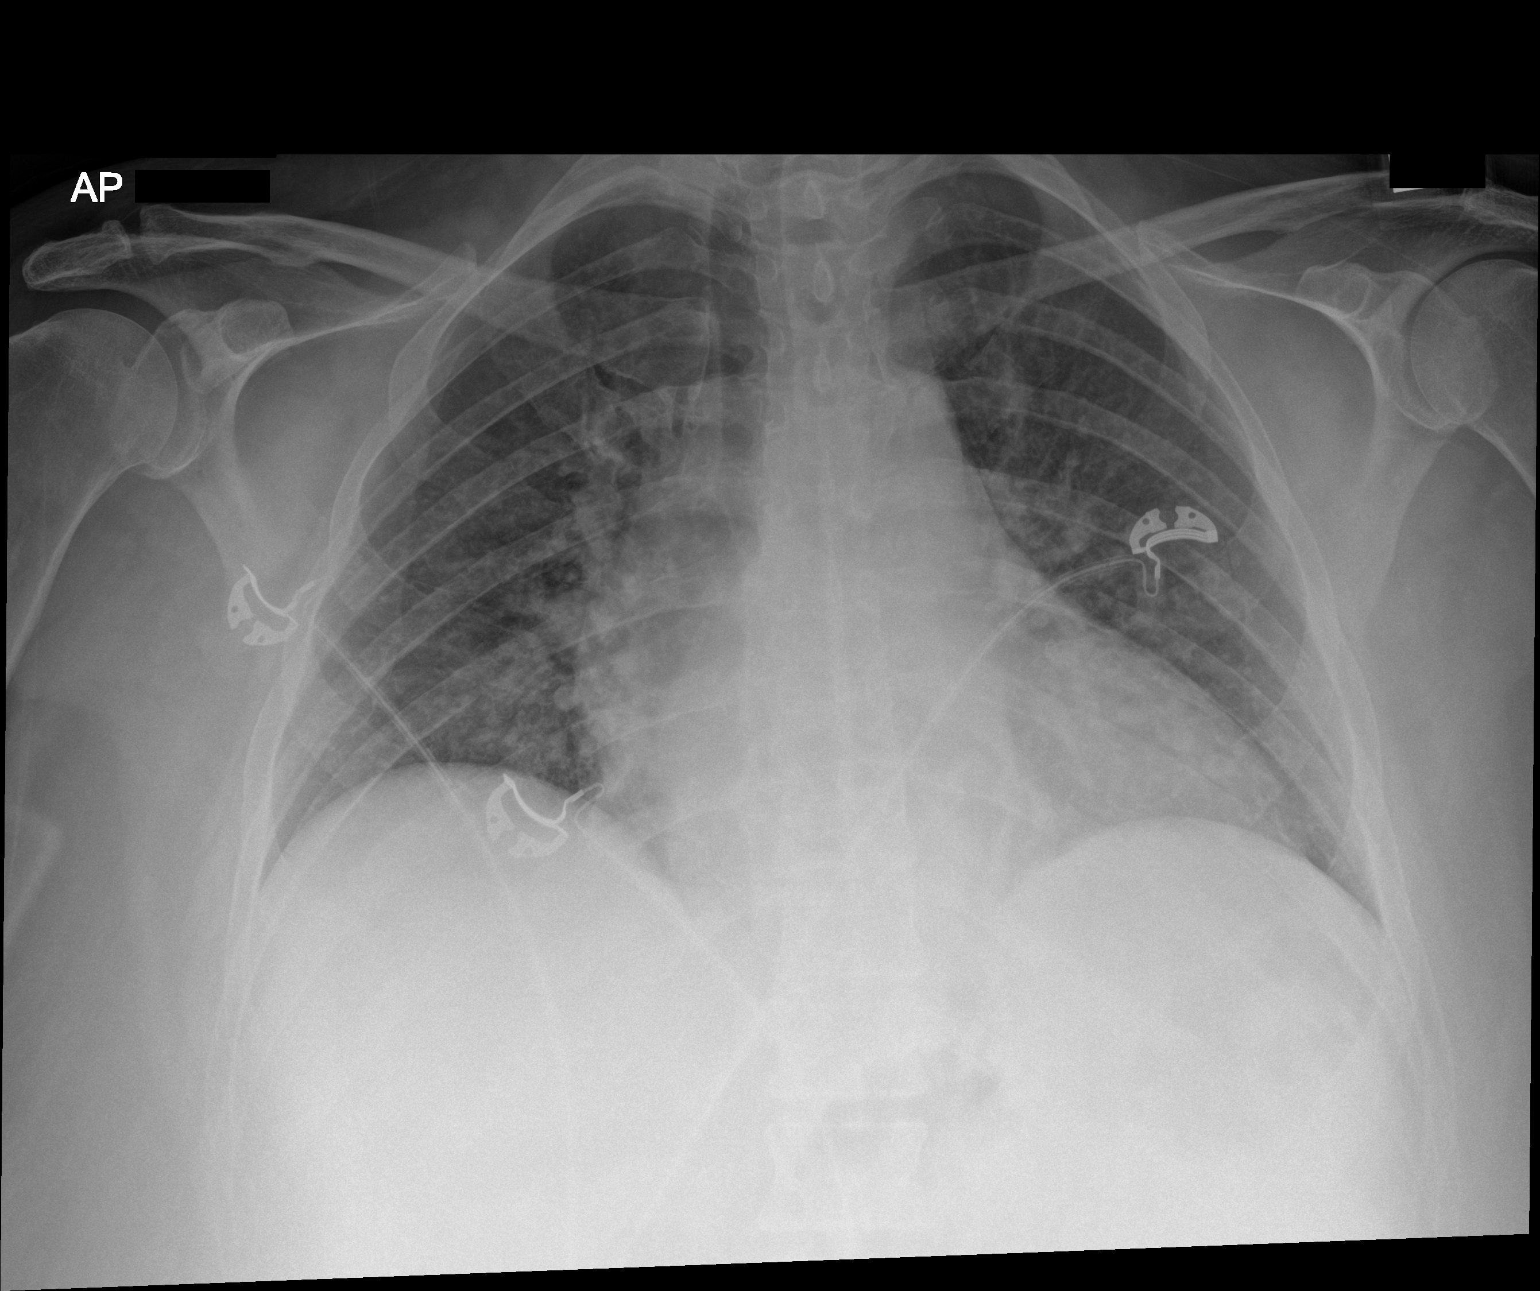

[1 of 1 positions shown; findings below may reference images not displayed]

FINDINGS: The cardiomediastinal silhouette is unchanged given technique. No
pneumothorax. No pulmonary nodules, masses, or focal infiltrates. No
overt edema.
IMPRESSION: No active disease.
